# Patient Record
Sex: Female | Born: 2000
Health system: Southern US, Community
[De-identification: ages and names within clinical notes are randomized; demographics above are authoritative.]

## PROBLEM LIST (undated history)

## (undated) DIAGNOSIS — T7840XA Allergy, unspecified, initial encounter: Secondary | ICD-10-CM

## (undated) DIAGNOSIS — H539 Unspecified visual disturbance: Secondary | ICD-10-CM

## (undated) DIAGNOSIS — J309 Allergic rhinitis, unspecified: Secondary | ICD-10-CM

## (undated) HISTORY — DX: Unspecified visual disturbance: H53.9

## (undated) HISTORY — DX: Allergic rhinitis, unspecified: J30.9

## (undated) HISTORY — DX: Allergy, unspecified, initial encounter: T78.40XA

---
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
## 2022-01-29 DIAGNOSIS — H5213 Myopia, bilateral: Secondary | ICD-10-CM | POA: Diagnosis not present

## 2022-02-01 DIAGNOSIS — M25561 Pain in right knee: Secondary | ICD-10-CM | POA: Diagnosis not present

## 2022-02-02 ENCOUNTER — Telehealth: Payer: Self-pay

## 2022-02-02 NOTE — Telephone Encounter (Signed)
Transition Care Management Follow-up Telephone Call ?Date of discharge and from where: 02/01/2022-[facility]  ?How have you been since you were released from the hospital? Pt stated she is doing fine.  ?Any questions or concerns? No ? ?Items Reviewed: ?Did the pt receive and understand the discharge instructions provided? Yes  ?Medications obtained and verified? Yes  ?Other? No  ?Any new allergies since your discharge? No  ?Dietary orders reviewed? No ?Do you have support at home? Yes  ? ?Home Care and Equipment/Supplies: ?Were home health services ordered? not applicable ?If so, what is the name of the agency? N/A  ?Has the agency set up a time to come to the patient's home? not applicable ?Were any new equipment or medical supplies ordered?  No ?What is the name of the medical supply agency? N/A ?Were you able to get the supplies/equipment? not applicable ?Do you have any questions related to the use of the equipment or supplies? No ? ?Functional Questionnaire: (I = Independent and D = Dependent) ?ADLs: I ? ?Bathing/Dressing- I ? ?Meal Prep- I ? ?Eating- I ? ?Maintaining continence- I ? ?Transferring/Ambulation- I ? ?Managing Meds- I ? ?Follow up appointments reviewed: ? ?PCP Hospital f/u appt confirmed? No   ?Specialist Hospital f/u appt confirmed? No   ?Are transportation arrangements needed? No  ?If their condition worsens, is the pt aware to call PCP or go to the Emergency Dept.? Yes ?Was the patient provided with contact information for the PCP's office or ED? Yes ?Was to pt encouraged to call back with questions or concerns? Yes  ?

## 2022-04-29 ENCOUNTER — Encounter: Payer: Medicaid Other | Admitting: Student

## 2022-04-29 DIAGNOSIS — Z020 Encounter for examination for admission to educational institution: Secondary | ICD-10-CM | POA: Insufficient documentation

## 2022-08-17 IMAGING — CT CT NECK W/ CM
3 of 4 series · 13 of 33 positions shown, 16 images · IV contrast (Omnipaque or Isovue)
Comparison: None.

CLINICAL DATA: Soft tissue swelling, sore throat

EXAM:
CT NECK WITH CONTRAST
TECHNIQUE: Multidetector CT imaging of the neck was performed using the
standard protocol following the bolus administration of intravenous
contrast.
CONTRAST:  75mL OMNIPAQUE IOHEXOL 300 MG/ML  SOLN

[Series 4: cor neck · coronal · 0.39mm/px · 3 of 101 slices shown]
[im 21/101  bone]
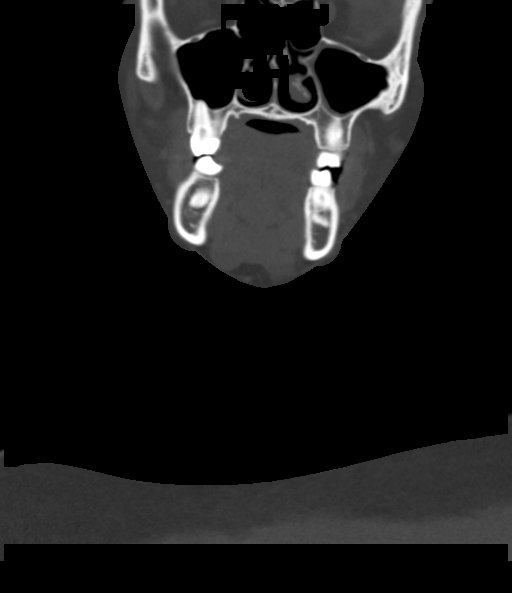
[im 41/101  bone]
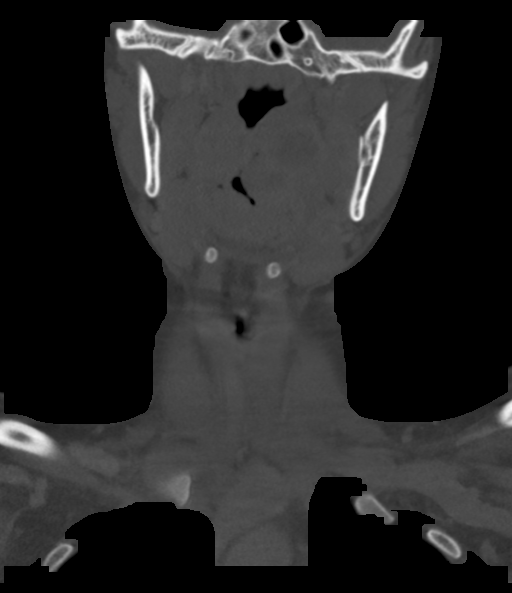
[im 61/101  bone]
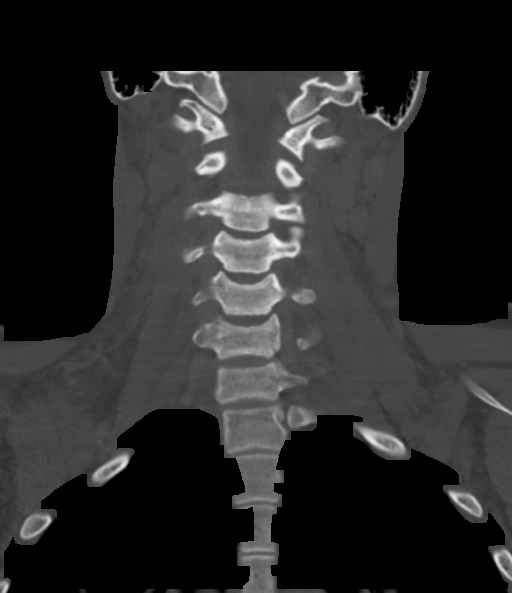

[Series 5: sag neck · sagittal · 0.47mm/px · 5 of 93 slices shown, 6 images]
[im 31/93  bone]
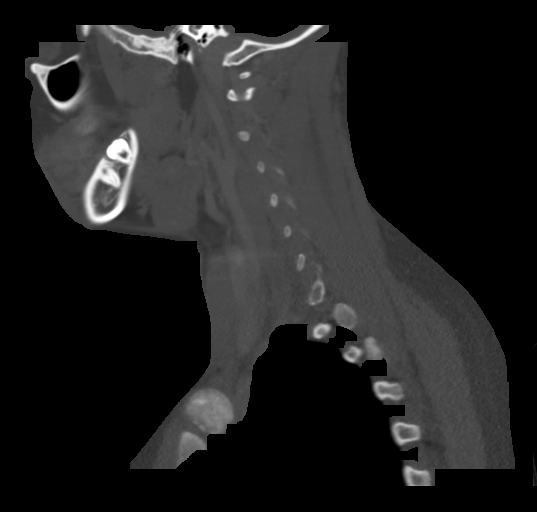
[im 39/93  bone]
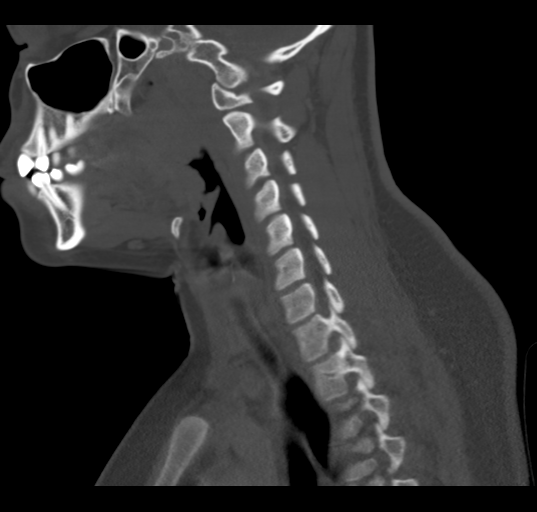
[im 47/93  soft-tissue]
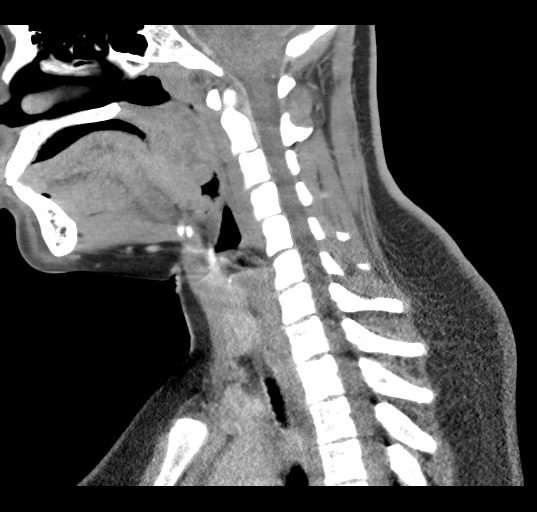
[im 47/93  bone]
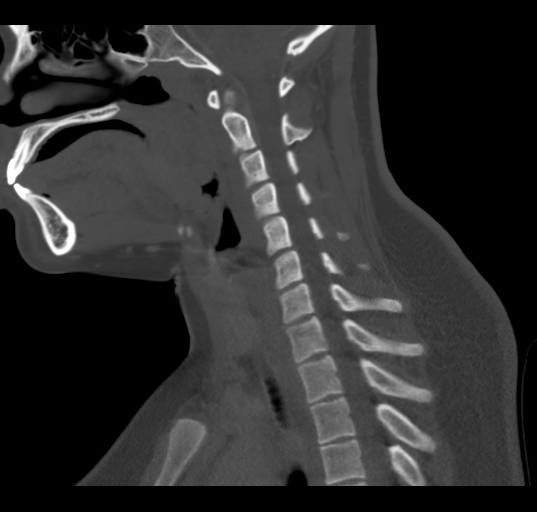
[im 54/93  bone]
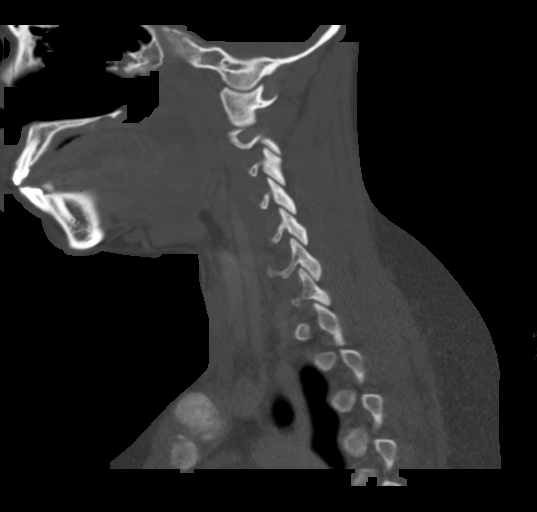
[im 62/93  bone]
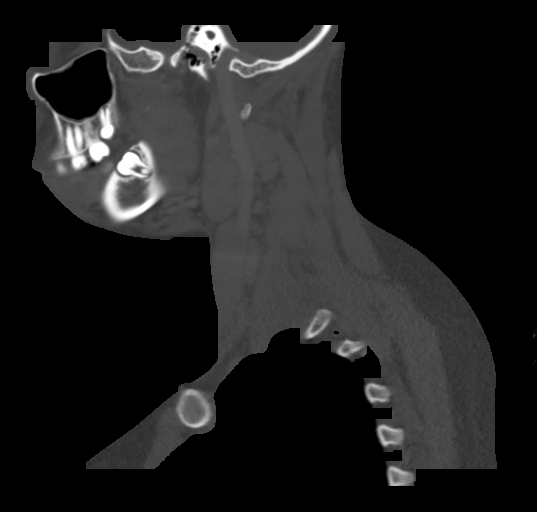

[Series 6: ax oropharynx · axial · 0.39mm/px · z∈[-201,-25]mm · 5 of 128 slices shown, 7 images]
[im 19/128  soft-tissue]
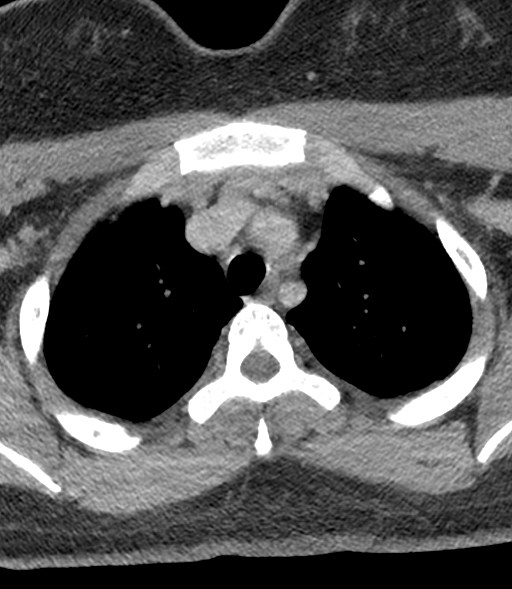
[im 19/128  bone]
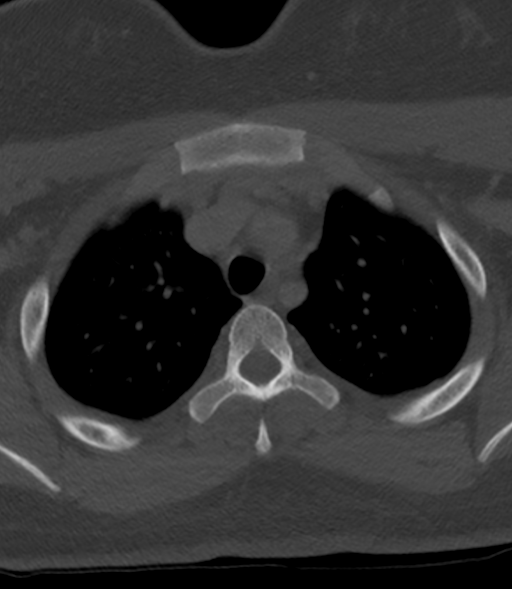
[im 37/128  bone]
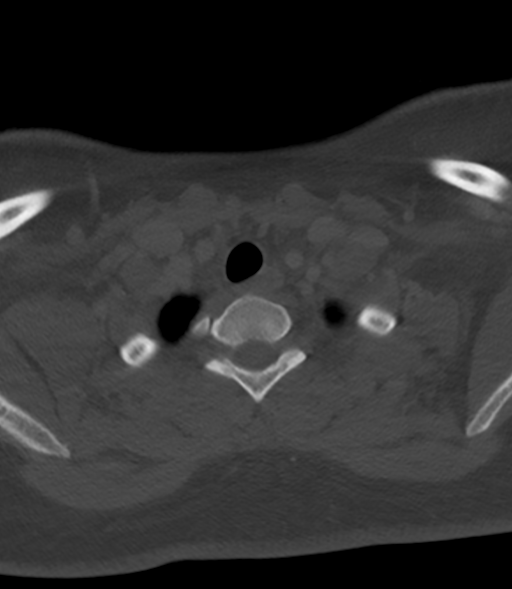
[im 73/128  bone]
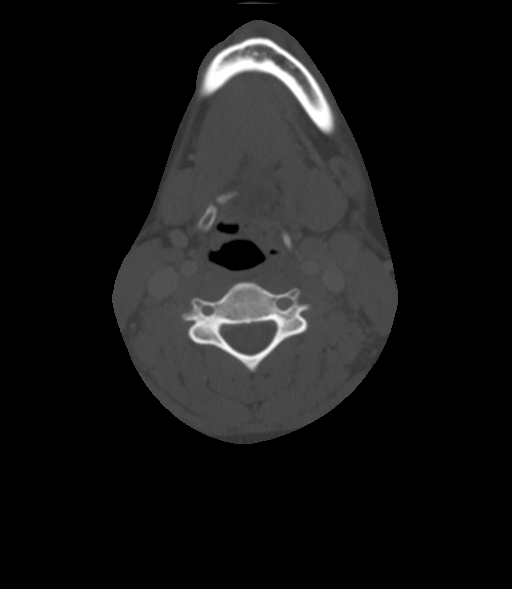
[im 91/128  bone]
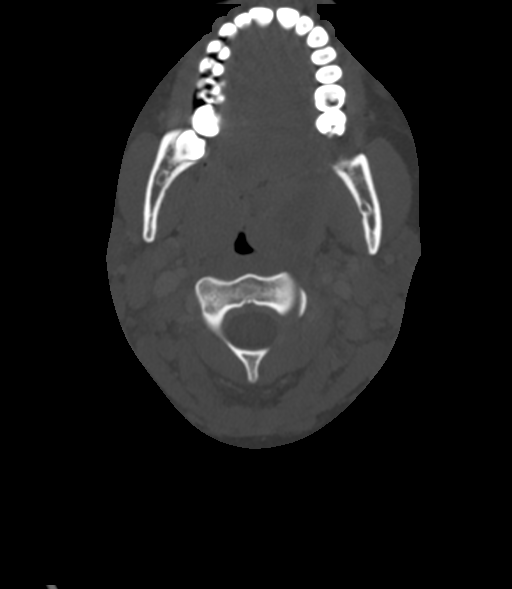
[im 109/128  soft-tissue]
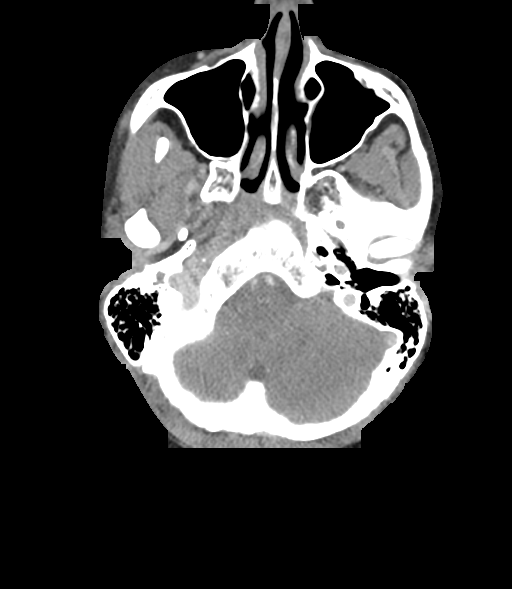
[im 109/128  bone]
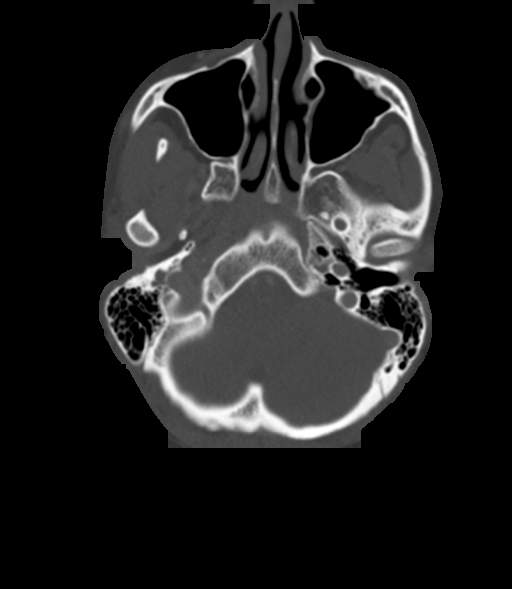

[13 of 33 positions shown; findings below may reference images not displayed]

FINDINGS: Pharynx and larynx: The nasal cavity is unremarkable. The adenoid
tonsils are prominent.

There is marked swelling of the left palatine tonsil and mild
swelling of the right palatine tonsil. There is a 2.4 cm x 1.6 cm x
2.2 cm peripherally enhancing fluid collection within the left
palatine tonsil which is lobulated along its inferior margin
consistent with tonsillar abscess. There is mild stranding in the
adjacent left parapharyngeal space and mild edema extending
inferiorly along the left oropharyngeal wall. There is partial
effacement of the oropharyngeal airway at the level of the tonsils.

The hypopharynx and larynx are unremarkable. There is no
retropharyngeal effusion or abscess.

Salivary glands: The parotid and submandibular glands are
unremarkable.

Thyroid: Unremarkable.

Lymph nodes: There are prominent level II lymph nodes bilaterally
measuring up to 1.7 cm on the left and 1.2 cm on the right, likely
reactive.

Vascular: Unremarkable.

Limited intracranial: The imaged portions of the intracranial
compartment are unremarkable.

Visualized orbits: The imaged globes and orbits are unremarkable.

Mastoids and visualized paranasal sinuses: The imaged paranasal
sinuses are clear. The imaged mastoid air cells are clear.

Skeleton: The bones are unremarkable. There is no acute osseous
abnormality or aggressive osseous lesion.

Upper chest: The imaged lung apices are clear.

Other: None.
IMPRESSION: 1. Findings consistent with tonsillitis/pharyngitis with a 2.4 cm
left tonsillar abscess. There is partial effacement of the
oropharyngeal airway at the level of the tonsils.
2. Prominent bilateral level II lymph nodes are likely reactive.

## 2024-01-13 DIAGNOSIS — M25561 Pain in right knee: Secondary | ICD-10-CM | POA: Diagnosis not present

## 2024-01-13 DIAGNOSIS — R519 Headache, unspecified: Secondary | ICD-10-CM | POA: Diagnosis not present
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
## 2024-04-13 ENCOUNTER — Encounter: Admitting: Obstetrics & Gynecology

## 2024-04-27 ENCOUNTER — Encounter: Admitting: Women's Health

## 2024-04-27 ENCOUNTER — Encounter: Payer: Self-pay | Admitting: Women's Health

## 2024-05-01 NOTE — Progress Notes (Signed)
 Pt thought she needed pap, will reschedule for after 10/1 when she turns 23yo.  This encounter was created in error - please disregard.

## 2024-07-07 ENCOUNTER — Encounter: Payer: Self-pay | Admitting: *Deleted

## 2024-10-10 ENCOUNTER — Ambulatory Visit: Payer: Self-pay

## 2024-10-10 ENCOUNTER — Ambulatory Visit
Admission: EM | Admit: 2024-10-10 | Discharge: 2024-10-10 | Disposition: A | Discharge status: Home / Self Care | Attending: Family Medicine | Admitting: Family Medicine

## 2024-10-10 DIAGNOSIS — J069 Acute upper respiratory infection, unspecified: Secondary | ICD-10-CM | POA: Diagnosis not present

## 2024-10-10 DIAGNOSIS — R509 Fever, unspecified: Secondary | ICD-10-CM | POA: Diagnosis not present

## 2024-10-10 LAB — POC COVID19/FLU A&B COMBO
Covid Antigen, POC: NEGATIVE
Influenza A Antigen, POC: NEGATIVE
Influenza B Antigen, POC: NEGATIVE

## 2024-10-10 LAB — POCT RAPID STREP A (OFFICE): Rapid Strep A Screen: NEGATIVE

## 2024-10-10 MED ORDER — PSEUDOEPH-BROMPHEN-DM 30-2-10 MG/5ML PO SYRP: 5.0000 mL | ORAL_SOLUTION | Freq: Four times a day (QID) | ORAL | 0 refills | Status: AC | PRN

## 2024-10-10 MED ORDER — LIDOCAINE VISCOUS HCL 2 % MT SOLN: 10.0000 mL | OROMUCOSAL | 0 refills | Status: AC | PRN

## 2024-10-10 NOTE — ED Provider Notes (Signed)
 " [facility]    CSN: [id number] Arrival date & time: 10/10/24  1644      History   Chief Complaint No chief complaint on file.   HPI Julia Swanson is a 23 y.o. female.   Patient presenting today with 3-day history of sore throat, headache, cough, congestion, fever.  Denies chest pain, shortness of breath, abdominal pain, vomiting, diarrhea.  So far trying Sudafed and TheraFlu with mild temporary benefit.  Multiple sick contacts at work recently.    Past Medical History:  Diagnosis Date   Allergic rhinitis 02/20/2013   Allergy    Vision abnormalities    astigmatism    Patient Active Problem List   Diagnosis Date Noted   School physical exam 04/29/2022   Obesity peds (BMI >=95 percentile) 04/27/2019   Osgood-Schlatter's disease of both knees 04/27/2019   Acanthosis 02/10/2016   Prediabetes 01/12/2016   Pharyngitis 09/17/2014   Pediatric body mass index (BMI) of 85th percentile to less than 95th percentile for age 63/05/2014   Allergic rhinitis 02/20/2013    History reviewed. No pertinent surgical history.  OB History   No obstetric history on file.      Home Medications    Prior to Admission medications  Medication Sig Start Date End Date Taking? Authorizing Provider  brompheniramine-pseudoephedrine-DM 30-2-10 MG/5ML syrup Take 5 mLs by mouth 4 (four) times daily as needed. 10/10/24  Yes Stuart Vernell Norris, PA-C  lidocaine  (XYLOCAINE ) 2 % solution Use as directed 10 mLs in the mouth or throat every 3 (three) hours as needed. 10/10/24  Yes Stuart Vernell Norris, PA-C  cetirizine  (ZYRTEC ) 10 MG tablet Take 1 tablet (10 mg total) by mouth daily. Patient not taking: Reported on 01/13/2021 08/02/19   Theotis Allena HERO, MD  Elastic Bandages & Supports (KNEE STRAP/UNIVERSAL) MISC 1 Units by Does not apply route as needed. Patella strap - to use with exercise as needed Patient not taking: No sig reported 12/18/16   McDonell, Ronal Amble, MD  fluticasone   (FLONASE ) 50 MCG/ACT nasal spray Place 2 sprays into both nostrils daily. Patient not taking: No sig reported 08/02/19   Theotis Allena HERO, MD  ibuprofen  (ADVIL ) 600 MG tablet TAKE 1 TABLET BY MOUTH EVERY 8 HOURS AS NEEDED FOR KNEE PAIN. TAKE WITH FOOD. 12/27/20   Theotis Allena HERO, MD  loratadine  (CLARITIN ) 10 MG tablet Take 1 tablet (10 mg total) by mouth daily. Patient not taking: No sig reported 09/08/18   McDonell, Ronal Amble, MD  meloxicam  (MOBIC ) 7.5 MG tablet Take 1 tablet (7.5 mg total) by mouth daily. Patient not taking: No sig reported 01/13/21   Margrette Taft BRAVO, MD    Family History Family History  Problem Relation Age of Onset   Hypertension Maternal Grandmother    Heart disease Maternal Grandmother    Hyperlipidemia Maternal Grandmother    Parkinson's disease Paternal Grandfather    Asthma Cousin    Allergies Cousin    Diabetes Maternal Aunt    Mental illness Maternal Aunt     Social History Social History[1]   Allergies   Patient has no known allergies.   Review of Systems Review of Systems PER HPI  Physical Exam Triage Vital Signs ED Triage Vitals  Encounter Vitals Group     BP 10/10/24 1727 105/75     Girls Systolic BP Percentile --      Girls Diastolic BP Percentile --      Boys Systolic BP Percentile --  Boys Diastolic BP Percentile --      Pulse Rate 10/10/24 1727 (!) 105     Resp 10/10/24 1727 18     Temp 10/10/24 1727 100 F (37.8 C)     Temp Source 10/10/24 1727 Oral     SpO2 10/10/24 1727 97 %     Weight --      Height --      Head Circumference --      Peak Flow --      Pain Score 10/10/24 1725 0     Pain Loc --      Pain Education --      Exclude from Growth Chart --    No data found.  Updated Vital Signs BP 105/75 (BP Location: Right Arm)   Pulse (!) 105   Temp 100 F (37.8 C) (Oral)   Resp 18   LMP 10/10/2024   SpO2 97%   Visual Acuity Right Eye Distance:   Left Eye Distance:   Bilateral Distance:    Right  Eye Near:   Left Eye Near:    Bilateral Near:     Physical Exam Vitals and nursing note reviewed.  Constitutional:      Appearance: Normal appearance.  HENT:     Head: Atraumatic.     Right Ear: Tympanic membrane and external ear normal.     Left Ear: Tympanic membrane and external ear normal.     Nose: Rhinorrhea present.     Mouth/Throat:     Mouth: Mucous membranes are moist.     Pharynx: Posterior oropharyngeal erythema present.  Eyes:     Extraocular Movements: Extraocular movements intact.     Conjunctiva/sclera: Conjunctivae normal.  Cardiovascular:     Rate and Rhythm: Normal rate and regular rhythm.     Heart sounds: Normal heart sounds.  Pulmonary:     Effort: Pulmonary effort is normal.     Breath sounds: Normal breath sounds. No wheezing or rales.  Musculoskeletal:        General: Normal range of motion.     Cervical back: Normal range of motion and neck supple.  Lymphadenopathy:     Cervical: No cervical adenopathy.  Skin:    General: Skin is warm and dry.  Neurological:     Mental Status: She is alert and oriented to person, place, and time.  Psychiatric:        Mood and Affect: Mood normal.        Thought Content: Thought content normal.      UC Treatments / Results  Labs (all labs ordered are listed, but only abnormal results are displayed) Labs Reviewed  POCT RAPID STREP A (OFFICE)  POC COVID19/FLU A&B COMBO    EKG   Radiology No results found.  Procedures Procedures (including critical care time)  Medications Ordered in UC Medications - No data to display  Initial Impression / Assessment and Plan / UC Course  I have reviewed the triage vital signs and the nursing notes.  Pertinent labs & imaging results that were available during my care of the patient were reviewed by me and considered in my medical decision making (see chart for details).     Febrile and mildly tachycardic in triage, otherwise vital signs reassuring.  Rapid  strep, COVID, flu all negative today.  Suspect viral respiratory infection.  Treat with viscous lidocaine , Bromfed, supportive over-the-counter medications at home care.  Return for worsening or unresolving symptoms.  Work note given.  Final Clinical  Impressions(s) / UC Diagnoses   Final diagnoses:  Viral URI with cough  Fever, unspecified   Discharge Instructions   None    ED Prescriptions     Medication Sig Dispense Auth. Provider   lidocaine  (XYLOCAINE ) 2 % solution Use as directed 10 mLs in the mouth or throat every 3 (three) hours as needed. 100 mL Stuart Vernell Norris, PA-C   brompheniramine-pseudoephedrine-DM 30-2-10 MG/5ML syrup Take 5 mLs by mouth 4 (four) times daily as needed. 120 mL Stuart Vernell Norris, [place]      PDMP not reviewed this encounter.    [1]  Social History Tobacco Use   Smoking status: Never   Smokeless tobacco: Never  Vaping Use   Vaping status: Never Used  Substance Use Topics   Alcohol use: No   Drug use: No     Stuart Vernell Norris, PA-C 10/10/24 1808  "

## 2024-10-10 NOTE — ED Triage Notes (Signed)
 Pt reports she has a sore throat, headache,  and cough x 3 days   Mom gave sudafed and theraflu
